# Patient Record
Sex: Male | Born: 2000 | Race: Black or African American | Hispanic: No | Marital: Single | Smoking: Never smoker
Health system: Southern US, Community
[De-identification: ages and names within clinical notes are randomized; demographics above are authoritative.]

## PROBLEM LIST (undated history)

## (undated) DIAGNOSIS — F909 Attention-deficit hyperactivity disorder, unspecified type: Secondary | ICD-10-CM

## (undated) DIAGNOSIS — J45909 Unspecified asthma, uncomplicated: Secondary | ICD-10-CM

## (undated) DIAGNOSIS — M419 Scoliosis, unspecified: Secondary | ICD-10-CM

## (undated) HISTORY — DX: Scoliosis, unspecified: M41.9

## (undated) HISTORY — DX: Unspecified asthma, uncomplicated: J45.909

---
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
## 2022-04-20 DIAGNOSIS — Z68.41 Body mass index (BMI) pediatric, greater than or equal to 95th percentile for age: Secondary | ICD-10-CM | POA: Diagnosis not present

## 2022-04-20 DIAGNOSIS — Z1331 Encounter for screening for depression: Secondary | ICD-10-CM | POA: Diagnosis not present

## 2022-04-20 DIAGNOSIS — Z0001 Encounter for general adult medical examination with abnormal findings: Secondary | ICD-10-CM | POA: Diagnosis not present

## 2022-08-01 DIAGNOSIS — T2642XA Burn of left eye and adnexa, part unspecified, initial encounter: Secondary | ICD-10-CM | POA: Diagnosis not present

## 2022-08-01 DIAGNOSIS — H16132 Photokeratitis, left eye: Secondary | ICD-10-CM | POA: Diagnosis not present

## 2022-08-02 DIAGNOSIS — T2642XA Burn of left eye and adnexa, part unspecified, initial encounter: Secondary | ICD-10-CM | POA: Diagnosis not present

## 2023-07-25 ENCOUNTER — Other Ambulatory Visit (HOSPITAL_COMMUNITY): Payer: Self-pay | Admitting: Family Medicine

## 2023-07-25 ENCOUNTER — Ambulatory Visit (HOSPITAL_COMMUNITY)
Admission: RE | Admit: 2023-07-25 | Discharge: 2023-07-25 | Disposition: A | Discharge status: Home / Self Care | Payer: Medicaid Other | Source: Ambulatory Visit | Attending: Family Medicine | Admitting: Family Medicine

## 2023-07-25 DIAGNOSIS — M4186 Other forms of scoliosis, lumbar region: Secondary | ICD-10-CM

## 2023-07-25 DIAGNOSIS — M419 Scoliosis, unspecified: Secondary | ICD-10-CM | POA: Diagnosis not present

## 2023-07-25 DIAGNOSIS — M545 Low back pain, unspecified: Secondary | ICD-10-CM | POA: Diagnosis not present

## 2023-07-25 DIAGNOSIS — Z1331 Encounter for screening for depression: Secondary | ICD-10-CM | POA: Diagnosis not present

## 2023-07-25 DIAGNOSIS — Z0001 Encounter for general adult medical examination with abnormal findings: Secondary | ICD-10-CM | POA: Diagnosis not present

## 2023-07-25 DIAGNOSIS — Z6821 Body mass index (BMI) 21.0-21.9, adult: Secondary | ICD-10-CM | POA: Diagnosis not present

## 2024-04-29 ENCOUNTER — Emergency Department (HOSPITAL_COMMUNITY)

## 2024-04-29 ENCOUNTER — Emergency Department (HOSPITAL_COMMUNITY)
Admission: EM | Admit: 2024-04-29 | Discharge: 2024-04-29 | Disposition: A | Discharge status: Home / Self Care | Attending: Emergency Medicine | Admitting: Emergency Medicine

## 2024-04-29 ENCOUNTER — Other Ambulatory Visit: Payer: Self-pay

## 2024-04-29 DIAGNOSIS — S060X0A Concussion without loss of consciousness, initial encounter: Secondary | ICD-10-CM

## 2024-04-29 DIAGNOSIS — S0990XA Unspecified injury of head, initial encounter: Secondary | ICD-10-CM | POA: Diagnosis not present

## 2024-04-29 DIAGNOSIS — Y92410 Unspecified street and highway as the place of occurrence of the external cause: Secondary | ICD-10-CM | POA: Insufficient documentation

## 2024-04-29 DIAGNOSIS — S161XXA Strain of muscle, fascia and tendon at neck level, initial encounter: Secondary | ICD-10-CM | POA: Diagnosis not present

## 2024-04-29 DIAGNOSIS — Z041 Encounter for examination and observation following transport accident: Secondary | ICD-10-CM | POA: Diagnosis not present

## 2024-04-29 DIAGNOSIS — G4489 Other headache syndrome: Secondary | ICD-10-CM | POA: Diagnosis not present
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
## 2024-04-29 NOTE — Discharge Instructions (Addendum)
 CT head and neck without any acute findings.  In case mild concussion would recommend brain rest minimize screens and TV time and phone time.  But listening to music is good.
